# Patient Record
Sex: Female | Born: 1995 | Race: Black or African American | Hispanic: No | Marital: Single | Smoking: Former smoker
Health system: Southern US, Community
[De-identification: ages and names within clinical notes are randomized; demographics above are authoritative.]

## PROBLEM LIST (undated history)

## (undated) DIAGNOSIS — N946 Dysmenorrhea, unspecified: Secondary | ICD-10-CM

## (undated) HISTORY — DX: Dysmenorrhea, unspecified: N94.6

## (undated) HISTORY — PX: WISDOM TOOTH EXTRACTION: SHX21

---
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
## 2016-11-11 ENCOUNTER — Encounter: Payer: Self-pay | Admitting: Emergency Medicine

## 2016-11-11 ENCOUNTER — Emergency Department
Admission: EM | Admit: 2016-11-11 | Discharge: 2016-11-11 | Disposition: A | Discharge status: Home / Self Care | Payer: 59 | Attending: Emergency Medicine | Admitting: Emergency Medicine

## 2016-11-11 DIAGNOSIS — R509 Fever, unspecified: Secondary | ICD-10-CM | POA: Insufficient documentation

## 2016-11-11 DIAGNOSIS — F17200 Nicotine dependence, unspecified, uncomplicated: Secondary | ICD-10-CM | POA: Diagnosis not present

## 2016-11-11 DIAGNOSIS — R112 Nausea with vomiting, unspecified: Secondary | ICD-10-CM | POA: Diagnosis present

## 2016-11-11 DIAGNOSIS — Z5321 Procedure and treatment not carried out due to patient leaving prior to being seen by health care provider: Secondary | ICD-10-CM | POA: Insufficient documentation

## 2016-11-11 DIAGNOSIS — R197 Diarrhea, unspecified: Secondary | ICD-10-CM | POA: Diagnosis not present

## 2016-11-11 LAB — CBC
HCT: 40.1 % (ref 35.0–47.0)
Hemoglobin: 13.6 g/dL (ref 12.0–16.0)
MCH: 29.5 pg (ref 26.0–34.0)
MCHC: 33.9 g/dL (ref 32.0–36.0)
MCV: 87 fL (ref 80.0–100.0)
Platelets: 254 10*3/uL (ref 150–440)
RBC: 4.61 MIL/uL (ref 3.80–5.20)
RDW: 12.4 % (ref 11.5–14.5)
WBC: 18.2 10*3/uL — ABNORMAL HIGH (ref 3.6–11.0)

## 2016-11-11 LAB — COMPREHENSIVE METABOLIC PANEL
ALBUMIN: 5 g/dL (ref 3.5–5.0)
ALK PHOS: 74 U/L (ref 38–126)
ALT: 13 U/L — ABNORMAL LOW (ref 14–54)
AST: 25 U/L (ref 15–41)
Anion gap: 11 (ref 5–15)
BILIRUBIN TOTAL: 1 mg/dL (ref 0.3–1.2)
BUN: 11 mg/dL (ref 6–20)
CALCIUM: 9.8 mg/dL (ref 8.9–10.3)
CO2: 26 mmol/L (ref 22–32)
CREATININE: 0.88 mg/dL (ref 0.44–1.00)
Chloride: 100 mmol/L — ABNORMAL LOW (ref 101–111)
GFR calc Af Amer: >60 mL/min (ref >60)
GFR calc non Af Amer: >60 mL/min (ref >60)
GLUCOSE: 97 mg/dL (ref 65–99)
Potassium: 3.2 mmol/L — ABNORMAL LOW (ref 3.5–5.1)
Sodium: 137 mmol/L (ref 135–145)
TOTAL PROTEIN: 8.6 g/dL — AB (ref 6.5–8.1)

## 2016-11-11 LAB — POCT RAPID STREP A: Streptococcus, Group A Screen (Direct): NEGATIVE

## 2016-11-11 LAB — RAPID INFLUENZA A&B ANTIGENS (ARMC ONLY)
INFLUENZA A (ARMC): NEGATIVE
INFLUENZA B (ARMC): NEGATIVE

## 2016-11-11 LAB — LIPASE, BLOOD: Lipase: 15 U/L (ref 11–51)

## 2016-11-11 MED ORDER — ONDANSETRON 4 MG PO TBDP
4.0000 mg | ORAL_TABLET | Freq: Once | ORAL | Status: AC | PRN
  Administered 2016-11-11: 4 mg via ORAL
  Filled 2016-11-11: qty 1

## 2016-11-11 NOTE — ED Triage Notes (Signed)
Patient states she developed N/V/D 2 days ago. States she was told she had fever at work and was sent home yesterday, but unsure of fever otherwise. Patient also reports sore throat now. States she has began to feel weak as well. Patient reports frequent episodes of N/V/D, and reports being unable to get to restroom for one of episodes.

## 2016-11-13 ENCOUNTER — Telehealth: Payer: Self-pay | Admitting: Emergency Medicine

## 2016-11-13 NOTE — Telephone Encounter (Addendum)
Called patient due to lwot to inquire about condition and follow up plans. Marland Kitchen.left message asking her to call me.  Would recommend she return here or go to pcp today for exam as she has elevated wbc.    Mom called me back and says pt has appt with pediatrician tomorrow.  She says patient actually is feeling improved.  I told her that if she worsened in any way or has pain in abd she should return in the mean time.

## 2017-01-23 ENCOUNTER — Encounter: Payer: Self-pay | Admitting: Certified Nurse Midwife

## 2017-01-23 ENCOUNTER — Ambulatory Visit (INDEPENDENT_AMBULATORY_CARE_PROVIDER_SITE_OTHER): Payer: 59 | Admitting: Certified Nurse Midwife

## 2017-01-23 VITALS — BP 110/66 | HR 58 | Ht 64.0 in | Wt 138.0 lb

## 2017-01-23 DIAGNOSIS — N920 Excessive and frequent menstruation with regular cycle: Secondary | ICD-10-CM | POA: Diagnosis not present

## 2017-01-23 DIAGNOSIS — N946 Dysmenorrhea, unspecified: Secondary | ICD-10-CM

## 2017-02-01 ENCOUNTER — Encounter: Payer: Self-pay | Admitting: Certified Nurse Midwife

## 2017-02-01 DIAGNOSIS — N946 Dysmenorrhea, unspecified: Secondary | ICD-10-CM | POA: Insufficient documentation

## 2017-02-01 DIAGNOSIS — N920 Excessive and frequent menstruation with regular cycle: Secondary | ICD-10-CM | POA: Insufficient documentation

## 2017-02-01 MED ORDER — LEVONORGEST-ETH ESTRAD 91-DAY 0.15-0.03 &0.01 MG PO TABS: 1.0000 | ORAL_TABLET | Freq: Every day | ORAL | 1 refills | Status: AC

## 2017-02-01 NOTE — Progress Notes (Signed)
Gynecology Annual Exam  PCP: No PCP Per Patient  Chief Complaint:  Chief Complaint  Patient presents with  . Painful cycles    Diarrhea    History of Present Illness: Patient is a 21 y.o. G0P0000 presents today with her mother  for a NP visit with complaints of heavy painful menstrual cycles. Menarche at age 5. Menses are monthly, lasting 4-5 days with 2-3 heavy days with quarter size clots, requiring pad changes every 2 hours She also has diarrhea with . She has cramping with her menses which is relieved with Midol or naprasyn. She also reports diarrhea with her menses.  LMP: Patient's last menstrual period was 12/28/2016 (exact date).  The patient has never been sexually active. She has no chronic medical illnesses. Specifically denies a history of heart, liver, autoimmune or clotting disorders. She has never been hospitalized. Has never had any surgery. She does some cigarettes daily.    Review of Systems: Review of Systems  Constitutional: Negative for chills, fever and weight loss.  HENT: Negative for congestion, sinus pain and sore throat.   Eyes: Negative for blurred vision and pain.  Respiratory: Negative for hemoptysis, shortness of breath and wheezing.   Cardiovascular: Negative for chest pain, palpitations and leg swelling.  Gastrointestinal: Negative for abdominal pain, blood in stool, diarrhea, heartburn, nausea and vomiting.  Genitourinary: Negative for dysuria, frequency, hematuria and urgency.       Positive for menorrhagia and dysmenorrhea  Musculoskeletal: Negative for back pain, joint pain and myalgias.  Skin: Negative for itching and rash.  Neurological: Negative for dizziness, tingling and headaches.  Endo/Heme/Allergies: Negative for environmental allergies and polydipsia. Does not bruise/bleed easily.       Negative for hirsutism   Psychiatric/Behavioral: Negative for depression. The patient is not nervous/anxious and does not have insomnia.      Past Medical History:  History reviewed. No pertinent past medical history.  Past Surgical History:  History reviewed. No pertinent surgical history.  Gynecologic History:  Patient's last menstrual period was 12/28/2016 (exact date). Obstetric History: G0P0000  Family History:  Family History  Problem Relation Age of Onset  . Pulmonary Hypertension Maternal Grandmother   . Diabetes Maternal Grandmother   . Heart attack Maternal Grandfather   . Endometriosis Maternal Aunt   . Breast cancer Neg Hx   . Ovarian cancer Neg Hx     Social History:  Social History   Social History  . Marital status: Single    Spouse name: N/A  . Number of children: N/A  . Years of education: N/A   Occupational History  . Not on file.   Social History Main Topics  . Smoking status: Current Every Day Smoker    Types: Cigarettes  . Smokeless tobacco: Never Used  . Alcohol use No  . Drug use: No  . Sexual activity: No   Other Topics Concern  . Not on file   Social History Narrative  . No narrative on file    Allergies:  No Known Allergies  Medications: Prior to Admission medications   Not on File    Physical Exam Vitals: Blood pressure 110/66, pulse (!) 58, height  (1.626 m), weight 62.6 kg (138 lb), last menstrual period 12/28/2016.  General: NAD HEENT: normocephalic, anicteric Thyroid: no enlargement, no palpable nodules Pulmonary: No increased work of breathing, CTAB Cardiovascular: RRR without murmur Abdomen: soft, non-tender, non-distended.  Umbilicus without lesions.  No hepatomegaly or masses palpable. No evidence of hernia  Lymph: no cervical or inguinal adenopathy Extremities: no edema, erythema, or tenderness Neurologic: Grossly intact Psychiatric: mood appropriate, affect full        Assessment: 21 y.o. G0P0000 with menorrhagia and dysmenorrhea who desires hormonal manipulation of menses to decrease flow and cramping with her menses.  Plan:  1.  Discussed options for cycle control including OCPs, depo provera, birth control patch and ring. Also discussed Nexplanon and IUDs. Patient desires extended pills. RX for [employer]. Explained how to take pills, when to start, what to do for missed pills, possible side effects and risks of thrombophilia. Specifically explained what to do for BTB. Explained warning signs to report.  2. Follow up in 3 months   Farrel Conners, CNM

## 2017-04-28 ENCOUNTER — Ambulatory Visit: Payer: 59 | Admitting: Certified Nurse Midwife

## 2017-07-29 ENCOUNTER — Encounter: Payer: Self-pay | Admitting: Certified Nurse Midwife

## 2017-07-29 ENCOUNTER — Ambulatory Visit (INDEPENDENT_AMBULATORY_CARE_PROVIDER_SITE_OTHER): Payer: 59 | Admitting: Certified Nurse Midwife

## 2017-07-29 VITALS — BP 98/58 | HR 68 | Ht 65.0 in | Wt 124.0 lb

## 2017-07-29 DIAGNOSIS — N946 Dysmenorrhea, unspecified: Secondary | ICD-10-CM | POA: Diagnosis not present

## 2017-07-29 DIAGNOSIS — N920 Excessive and frequent menstruation with regular cycle: Secondary | ICD-10-CM | POA: Diagnosis not present

## 2017-07-29 DIAGNOSIS — Z113 Encounter for screening for infections with a predominantly sexual mode of transmission: Secondary | ICD-10-CM

## 2017-07-29 NOTE — Progress Notes (Signed)
PCP: Patient, No Pcp Per  Chief Complaint:  Chief Complaint  Patient presents with  . Dysmenorrhea    History of Present Illness: Ariel Shaw is a 21 y.o. G0P0000 presents for a follow up visit. The patient was seen in March 2018 for a NP visit with complaints of menorrhagia and dysmenorrhea. She was begun on [place], but only took them for 1 month. Stopped due to nausea and vomiting.   Her menses are regular, they occur every month, and they last 5-6 days. Her flow is heavy for 3-4 days requiring pad changes every 2 1/2 hours and has quarter size and larger blood clots on days 3 and 4. She does not have intermenstrual bleeding. Her last menstrual period was 07/23/2017. She has painful menses also. Complains of severe back pain and lower abdominal pain starting with day 1 and continuing thru day 4. The pain in her back radiates to her thighs and sometimes it is too painful to move.  Heating pad is most helpful for her back pain. Does take Naprasyn prior to her menses starting, but not effective. Misses work due to the dysmenorrhea. Last pap smear: NA due to age.   The patient is sexually active. Has had intercourse on 3 occasions and has used condoms.   Since her last visit, she has had no significant changes in her health. she has stopped smoking since her last visit.  Her past medical history is unremarkable.  Family history is remarkable for endometriosis in her maternal aunt and maternal great aunt.   Review of Systems: ROS-see HPI  Past Medical History:  Past Medical History:  Diagnosis Date  . Dysmenorrhea     Past Surgical History:  Past Surgical History:  Procedure Laterality Date  . WISDOM TOOTH EXTRACTION      Family History:  Family History  Problem Relation Age of Onset  . Pulmonary Hypertension Maternal Grandmother   . Diabetes Maternal Grandmother   . Heart attack Maternal Grandfather   . Endometriosis Maternal Aunt   . Endometriosis Other   .  Breast cancer Neg Hx   . Ovarian cancer Neg Hx     Social History:  Social History   Social History  . Marital status: Single    Spouse name: N/A  . Number of children: N/A  . Years of education: N/A   Occupational History  . Not on file.   Social History Main Topics  . Smoking status: Former Smoker    Types: Cigarettes  . Smokeless tobacco: Never Used     Comment: quit 05/17/17  . Alcohol use No  . Drug use: No  . Sexual activity: No   Other Topics Concern  . Not on file   Social History Narrative  . No narrative on file    Allergies:  No Known Allergies  Medications: Physical Exam Vitals: BP (!) 98/58   Pulse 68   Ht  (1.651 m)   Wt 124 lb (56.2 kg)   LMP 07/23/2017 (Exact Date)   BMI 20.63 kg/m   General: BF in NAD HEENT: normocephalic, anicteric. Conjunctiva appears pale Neck: no thyroid enlargement, no palpable nodules, no cervical lymphadenopathy  Abdomen: Soft, non-tender, non-distended.  Umbilicus without lesions.  No hepatomegaly or masses palpable. No evidence of hernia. Genitourinary:  External: Normal external female genitalia.  Normal urethral meatus, normal Bartholin's and Skene's glands.    Vagina: Normal vaginal mucosa, no masses   Cervix: mobile,  non-tender  Uterus:  Anteflexeded, normal size, shape, and consistency, mobile, and non-tender  Adnexa: No adnexal masses, non-tender  Rectal: deferred  Lymphatic: no evidence of inguinal lymphadenopathy Extremities: no edema, erythema, or tenderness Neurologic: Grossly intact Psychiatric: mood appropriate, affect full     Assessment: 21 y.o. G0P0000 with dysmenorrhea and menorrhagia  Plan:  1) TVUS ordered, will follow up after ultrasound.  2) STI screening was offered and accepted.  3) Cervical cancer screening - Pap not indicated.   4) Discussed other treatments for dysmenorrhea and menorrhagia like nuvaring, BC patch. Interested in [place]. Wants to wait until after  ultrasound to make decision.   Farrel Conners, CNM  5) CBC

## 2017-07-30 LAB — CBC WITH DIFFERENTIAL/PLATELET
BASOS: 0 %
Basophils Absolute: 0 10*3/uL (ref 0.0–0.2)
EOS (ABSOLUTE): 0 10*3/uL (ref 0.0–0.4)
EOS: 0 %
HEMATOCRIT: 38.2 % (ref 34.0–46.6)
HEMOGLOBIN: 13 g/dL (ref 11.1–15.9)
IMMATURE GRANS (ABS): 0 10*3/uL (ref 0.0–0.1)
Immature Granulocytes: 0 %
LYMPHS: 41 %
Lymphocytes Absolute: 2.5 10*3/uL (ref 0.7–3.1)
MCH: 30 pg (ref 26.6–33.0)
MCHC: 34 g/dL (ref 31.5–35.7)
MCV: 88 fL (ref 79–97)
MONOCYTES: 8 %
Monocytes Absolute: 0.5 10*3/uL (ref 0.1–0.9)
Neutrophils Absolute: 3.1 10*3/uL (ref 1.4–7.0)
Neutrophils: 51 %
Platelets: 289 10*3/uL (ref 150–379)
RBC: 4.33 x10E6/uL (ref 3.77–5.28)
RDW: 12.2 % — ABNORMAL LOW (ref 12.3–15.4)
WBC: 6.1 10*3/uL (ref 3.4–10.8)

## 2017-07-31 LAB — CHLAMYDIA/GONOCOCCUS/TRICHOMONAS, NAA
Chlamydia by NAA: NEGATIVE
Gonococcus by NAA: NEGATIVE
Trich vag by NAA: NEGATIVE

## 2017-08-19 ENCOUNTER — Ambulatory Visit: Payer: 59 | Admitting: Certified Nurse Midwife

## 2017-08-19 ENCOUNTER — Other Ambulatory Visit: Payer: 59

## 2019-10-27 ENCOUNTER — Ambulatory Visit: Payer: Managed Care, Other (non HMO) | Attending: Internal Medicine

## 2019-10-27 DIAGNOSIS — Z20822 Contact with and (suspected) exposure to covid-19: Secondary | ICD-10-CM

## 2019-10-28 LAB — NOVEL CORONAVIRUS, NAA: SARS-CoV-2, NAA: DETECTED — AB

## 2019-11-03 ENCOUNTER — Ambulatory Visit: Payer: Managed Care, Other (non HMO) | Attending: Internal Medicine

## 2019-11-03 DIAGNOSIS — Z20822 Contact with and (suspected) exposure to covid-19: Secondary | ICD-10-CM

## 2019-11-09 LAB — NOVEL CORONAVIRUS, NAA

## 2019-11-10 ENCOUNTER — Ambulatory Visit: Payer: Managed Care, Other (non HMO) | Attending: Internal Medicine

## 2019-11-10 DIAGNOSIS — Z20822 Contact with and (suspected) exposure to covid-19: Secondary | ICD-10-CM

## 2019-11-12 LAB — NOVEL CORONAVIRUS, NAA: SARS-CoV-2, NAA: DETECTED — AB

## 2019-12-07 ENCOUNTER — Ambulatory Visit: Payer: Managed Care, Other (non HMO) | Attending: Internal Medicine

## 2019-12-07 DIAGNOSIS — Z20822 Contact with and (suspected) exposure to covid-19: Secondary | ICD-10-CM

## 2019-12-08 LAB — NOVEL CORONAVIRUS, NAA: SARS-CoV-2, NAA: NOT DETECTED

## 2020-02-09 ENCOUNTER — Ambulatory Visit: Payer: Managed Care, Other (non HMO) | Attending: Internal Medicine

## 2020-02-09 DIAGNOSIS — Z20822 Contact with and (suspected) exposure to covid-19: Secondary | ICD-10-CM

## 2020-02-10 LAB — NOVEL CORONAVIRUS, NAA: SARS-CoV-2, NAA: NOT DETECTED

## 2020-02-10 LAB — SARS-COV-2, NAA 2 DAY TAT

## 2023-05-04 ENCOUNTER — Ambulatory Visit
Admission: EM | Admit: 2023-05-04 | Discharge: 2023-05-04 | Disposition: A | Discharge status: Home / Self Care | Payer: Managed Care, Other (non HMO) | Attending: Urgent Care | Admitting: Urgent Care

## 2023-05-04 DIAGNOSIS — N898 Other specified noninflammatory disorders of vagina: Secondary | ICD-10-CM | POA: Insufficient documentation

## 2023-05-04 NOTE — ED Triage Notes (Signed)
Patient presents to [facility] for vaginal discharge x 3 days. Denies urinary symptoms. Req STD testing.

## 2023-05-04 NOTE — ED Provider Notes (Signed)
Renaldo Fiddler    CSN: [id number] Arrival date & time: 05/04/23  1350      History   Chief Complaint Chief Complaint  Patient presents with   Vaginal Discharge    Entered by patient    HPI Emmagene Sester is a 27 y.o. female.   HPI  This urgent care with complaint of vaginal discharge x 3 days.  Denies any urinary symptoms.  Requesting STD testing.  Discharge is described as white, creamy.  Denies any discomfort.  Past Medical History:  Diagnosis Date   Dysmenorrhea     Patient Active Problem List   Diagnosis Date Noted   Menorrhagia with regular cycle 02/01/2017   Dysmenorrhea 02/01/2017    Past Surgical History:  Procedure Laterality Date   WISDOM TOOTH EXTRACTION      OB History     Gravida  0   Para  0   Term  0   Preterm  0   AB  0   Living  0      SAB  0   IAB  0   Ectopic  0   Multiple  0   Live Births  0            Home Medications    Prior to Admission medications   Medication Sig Start Date End Date Taking? Authorizing Provider  Levonorgestrel-Ethinyl Estradiol (AMETHIA,CAMRESE) 0.15-0.03 &0.01 MG tablet Take 1 tablet by mouth daily. Patient not taking: Reported on 07/29/2017 01/23/17   Farrel Conners, CNM    Family History Family History  Problem Relation Age of Onset   Pulmonary Hypertension Maternal Grandmother    Diabetes Maternal Grandmother    Heart attack Maternal Grandfather    Endometriosis Maternal Aunt    Endometriosis Other    Breast cancer Neg Hx    Ovarian cancer Neg Hx     Social History Social History   Tobacco Use   Smoking status: Former    Types: Cigarettes   Smokeless tobacco: Never   Tobacco comments:    quit 05/17/17  Vaping Use   Vaping Use: Never used  Substance Use Topics   Alcohol use: No   Drug use: No     Allergies   Patient has no known allergies.   Review of Systems Review of Systems   Physical Exam Triage Vital Signs ED Triage Vitals [05/04/23 1405]   Enc Vitals Group     BP      Pulse      Resp      Temp      Temp src      SpO2      Weight      Height      Head Circumference      Peak Flow      Pain Score 0     Pain Loc      Pain Edu?      Excl. in GC?    No data found.  Updated Vital Signs LMP 04/18/2023 (Approximate)   Visual Acuity Right Eye Distance:   Left Eye Distance:   Bilateral Distance:    Right Eye Near:   Left Eye Near:    Bilateral Near:     Physical Exam   UC Treatments / Results  Labs (all labs ordered are listed, but only abnormal results are displayed) Labs Reviewed - No data to display  EKG   Radiology No results found.  Procedures Procedures (including critical care time)  Medications Ordered  in UC Medications - No data to display  Initial Impression / Assessment and Plan / UC Course  I have reviewed the triage vital signs and the nursing notes.  Pertinent labs & imaging results that were available during my care of the patient were reviewed by me and considered in my medical decision making (see chart for details).   Scottlynn Nusser is a 27 y.o. female presenting with vaginal discharge. Patient is afebrile without recent antipyretics, satting well on room air. Overall is well appearing though non-toxic, well hydrated, without respiratory distress.   Reviewed relevant chart history.   Vaginal self swab is obtained and results are pending.    Final Clinical Impressions(s) / UC Diagnoses   Final diagnoses:  None   Discharge Instructions   None    ED Prescriptions   None    PDMP not reviewed this encounter.   Charma Igo, [place] 05/04/23 [phone]

## 2023-05-04 NOTE — Discharge Instructions (Signed)
Follow up here or with your primary care provider if your symptoms are worsening or not improving.     

## 2023-05-05 LAB — CERVICOVAGINAL ANCILLARY ONLY
Bacterial Vaginitis (gardnerella): POSITIVE — AB
Candida Glabrata: NEGATIVE
Candida Vaginitis: NEGATIVE
Chlamydia: NEGATIVE
Comment: NEGATIVE
Comment: NEGATIVE
Comment: NEGATIVE
Comment: NEGATIVE
Comment: NEGATIVE
Comment: NORMAL
Neisseria Gonorrhea: NEGATIVE
Trichomonas: POSITIVE — AB

## 2023-05-06 ENCOUNTER — Telehealth (HOSPITAL_COMMUNITY): Payer: Self-pay | Admitting: Emergency Medicine

## 2023-05-06 MED ORDER — METRONIDAZOLE 500 MG PO TABS: 500.0000 mg | ORAL_TABLET | Freq: Two times a day (BID) | ORAL | 0 refills | Status: AC
